# Patient Record
Sex: Male | Born: 1955 | Race: White | Hispanic: No | Marital: Married
Health system: Southern US, Community
[De-identification: ages and names within clinical notes are randomized; demographics above are authoritative.]

---

## 2005-03-30 ENCOUNTER — Ambulatory Visit: Payer: Self-pay | Admitting: Physician Assistant

## 2005-04-22 ENCOUNTER — Encounter: Payer: Self-pay | Admitting: Unknown Physician Specialty

## 2005-05-07 ENCOUNTER — Encounter: Payer: Self-pay | Admitting: Unknown Physician Specialty

## 2011-08-18 LAB — LIPASE, BLOOD: Lipase: 153 U/L (ref 73–393)

## 2011-08-18 LAB — URINALYSIS, COMPLETE
Bacteria: NONE SEEN
Blood: NEGATIVE
Glucose,UR: NEGATIVE mg/dL (ref 0–75)
Leukocyte Esterase: NEGATIVE
Nitrite: NEGATIVE
Ph: 5 (ref 4.5–8.0)
Specific Gravity: 1.025 (ref 1.003–1.030)
WBC UR: 1 /HPF (ref 0–5)

## 2011-08-18 LAB — CBC
HCT: 45.6 % (ref 40.0–52.0)
MCV: 87 fL (ref 80–100)
WBC: 4.1 10*3/uL (ref 3.8–10.6)

## 2011-08-18 LAB — COMPREHENSIVE METABOLIC PANEL
Albumin: 3.6 g/dL (ref 3.4–5.0)
Alkaline Phosphatase: 324 U/L — ABNORMAL HIGH (ref 50–136)
Anion Gap: 9 (ref 7–16)
BUN: 16 mg/dL (ref 7–18)
Bilirubin,Total: 4.6 mg/dL — ABNORMAL HIGH (ref 0.2–1.0)
Co2: 22 mmol/L (ref 21–32)
Creatinine: 0.81 mg/dL (ref 0.60–1.30)
EGFR (African American): >60
EGFR (Non-African Amer.): >60
Osmolality: 279 (ref 275–301)
SGOT(AST): 322 U/L — ABNORMAL HIGH (ref 15–37)
Sodium: 137 mmol/L (ref 136–145)

## 2011-08-19 ENCOUNTER — Inpatient Hospital Stay: Payer: Self-pay | Admitting: Surgery

## 2011-08-19 LAB — BASIC METABOLIC PANEL
Anion Gap: 8 (ref 7–16)
BUN: 11 mg/dL (ref 7–18)
Calcium, Total: 8 mg/dL — ABNORMAL LOW (ref 8.5–10.1)
Chloride: 107 mmol/L (ref 98–107)
Creatinine: 1.1 mg/dL (ref 0.60–1.30)
EGFR (African American): >60
Potassium: 4 mmol/L (ref 3.5–5.1)

## 2011-08-19 LAB — PROTIME-INR
INR: 0.9
Prothrombin Time: 12.8 secs (ref 11.5–14.7)

## 2011-08-19 LAB — CBC WITH DIFFERENTIAL/PLATELET
Eosinophil #: 0.1 10*3/uL (ref 0.0–0.7)
Eosinophil %: 2.5 %
MCH: 29.3 pg (ref 26.0–34.0)
MCHC: 33.4 g/dL (ref 32.0–36.0)
MCV: 88 fL (ref 80–100)
Monocyte %: 10.2 %
Neutrophil #: 1.7 10*3/uL (ref 1.4–6.5)
RBC: 5.05 10*6/uL (ref 4.40–5.90)
RDW: 13.8 % (ref 11.5–14.5)
WBC: 3.3 10*3/uL — ABNORMAL LOW (ref 3.8–10.6)

## 2011-08-19 LAB — HEPATIC FUNCTION PANEL A (ARMC)
Albumin: 3.3 g/dL — ABNORMAL LOW (ref 3.4–5.0)
Bilirubin, Direct: 3 mg/dL — ABNORMAL HIGH (ref 0.00–0.20)
SGOT(AST): 280 U/L — ABNORMAL HIGH (ref 15–37)
Total Protein: 6.2 g/dL — ABNORMAL LOW (ref 6.4–8.2)

## 2011-08-19 LAB — HEMOGLOBIN A1C: Hemoglobin A1C: 8.4 % — ABNORMAL HIGH (ref 4.2–6.3)

## 2011-08-20 LAB — COMPREHENSIVE METABOLIC PANEL
Albumin: 3 g/dL — ABNORMAL LOW (ref 3.4–5.0)
Anion Gap: 11 (ref 7–16)
BUN: 9 mg/dL (ref 7–18)
Bilirubin,Total: 2 mg/dL — ABNORMAL HIGH (ref 0.2–1.0)
Calcium, Total: 8.2 mg/dL — ABNORMAL LOW (ref 8.5–10.1)
Co2: 20 mmol/L — ABNORMAL LOW (ref 21–32)
Creatinine: 0.85 mg/dL (ref 0.60–1.30)
EGFR (African American): >60
EGFR (Non-African Amer.): >60
Glucose: 119 mg/dL — ABNORMAL HIGH (ref 65–99)
Osmolality: 279 (ref 275–301)
SGOT(AST): 142 U/L — ABNORMAL HIGH (ref 15–37)
SGPT (ALT): 418 U/L — ABNORMAL HIGH
Sodium: 140 mmol/L (ref 136–145)
Total Protein: 5.8 g/dL — ABNORMAL LOW (ref 6.4–8.2)

## 2011-08-20 LAB — LIPASE, BLOOD: Lipase: 126 U/L (ref 73–393)

## 2011-08-21 LAB — HEPATIC FUNCTION PANEL A (ARMC)
Albumin: 3.2 g/dL — ABNORMAL LOW (ref 3.4–5.0)
Bilirubin,Total: 1.5 mg/dL — ABNORMAL HIGH (ref 0.2–1.0)
SGPT (ALT): 313 U/L — ABNORMAL HIGH
Total Protein: 6.3 g/dL — ABNORMAL LOW (ref 6.4–8.2)

## 2011-11-23 ENCOUNTER — Ambulatory Visit: Payer: Self-pay | Admitting: Surgery

## 2011-11-23 LAB — BASIC METABOLIC PANEL
BUN: 19 mg/dL — ABNORMAL HIGH (ref 7–18)
Calcium, Total: 8.9 mg/dL (ref 8.5–10.1)
Co2: 29 mmol/L (ref 21–32)
EGFR (African American): >60
EGFR (Non-African Amer.): >60
Glucose: 172 mg/dL — ABNORMAL HIGH (ref 65–99)
Potassium: 4.2 mmol/L (ref 3.5–5.1)
Sodium: 138 mmol/L (ref 136–145)

## 2011-11-23 LAB — HEPATIC FUNCTION PANEL A (ARMC)
Albumin: 3.8 g/dL (ref 3.4–5.0)
Alkaline Phosphatase: 77 U/L (ref 50–136)
Bilirubin, Direct: 0.2 mg/dL (ref 0.00–0.20)
Bilirubin,Total: 1.1 mg/dL — ABNORMAL HIGH (ref 0.2–1.0)
SGOT(AST): 18 U/L (ref 15–37)
SGPT (ALT): 23 U/L (ref 12–78)
Total Protein: 6.6 g/dL (ref 6.4–8.2)

## 2011-11-23 LAB — CBC WITH DIFFERENTIAL/PLATELET
Basophil #: 0 10*3/uL (ref 0.0–0.1)
Eosinophil #: 0.1 10*3/uL (ref 0.0–0.7)
HCT: 42.7 % (ref 40.0–52.0)
Lymphocyte #: 1.2 10*3/uL (ref 1.0–3.6)
Lymphocyte %: 35.5 %
MCH: 29.1 pg (ref 26.0–34.0)
MCHC: 34.2 g/dL (ref 32.0–36.0)
MCV: 85 fL (ref 80–100)
Monocyte #: 0.3 x10 3/mm (ref 0.2–1.0)
Neutrophil #: 1.8 10*3/uL (ref 1.4–6.5)
Platelet: 162 10*3/uL (ref 150–440)
RDW: 13.6 % (ref 11.5–14.5)
WBC: 3.3 10*3/uL — ABNORMAL LOW (ref 3.8–10.6)

## 2011-11-30 ENCOUNTER — Ambulatory Visit: Payer: Self-pay | Admitting: Surgery

## 2012-01-28 ENCOUNTER — Encounter: Payer: Self-pay | Admitting: Internal Medicine

## 2014-06-26 NOTE — Op Note (Signed)
PATIENT NAME:  Dalton Lowery, Dalton Lowery MR#:  [id number] DATE OF BIRTH: [name] May 27, 1955  DATE OF PROCEDURE:  11/30/2011  PREOPERATIVE DIAGNOSIS: Chronic cholecystitis and cholelithiasis.   POSTOPERATIVE DIAGNOSIS: Chronic cholecystitis and cholelithiasis.   OPERATION: Laparoscopic cholecystectomy with cholangiography.   SURGEON: Quentin Orealph L. Ely. M.D.   ANESTHESIA: General.   OPERATIVE PROCEDURE: With the patient in the supine position after the induction of appropriate general anesthesia, the patient's abdomen was prepped with ChloraPrep and draped with sterile towels. The patient was placed in the head down, feet up position. A small infraumbilical incision was made in the standard fashion and carried down bluntly through the subcutaneous tissue. The Veress needle was used to cannulate the peritoneal cavity. CO2 was insufflated to appropriate pressure measurements. When approximately 2.5 liters of CO2 were instilled, the Veress needle was withdrawn and an 11 mm [employer] port was inserted into the peritoneal cavity. Peritoneal position was confirmed and CO2 was reinsufflated. The patient was placed in the head up, feet down position and rotated slightly to the left side. A subxiphoid transverse incision was made and an 11 mm port inserted under direct vision. Two lateral ports 5 mm in size were inserted under direct vision. The gallbladder was small and shrunken with multiple adhesions. He was grasped and retracted superiorly and laterally. The hepatoduodenal ligament was dissected exposing the cystic duct and cystic artery. The cystic duct was clipped on the gallbladder side and opened. An on-table cholangiogram using dynamic fluoroscopy revealed free flow of dye into the duodenum. Intrahepatic radicles were seen. No obstruction was identified. The catheter was withdrawn and the cystic duct doubly clipped on the common duct side and divided. The cystic artery was doubly clipped and divided. The gallbladder was  then dissected free from its bed and delivered using hook and cautery apparatus. There was a small rent in the dome of the gallbladder from the retractor. Once the gallbladder was free, it was collected in an Endo Catch apparatus and removed through the subxiphoid incision. The area was then copiously suctioned and irrigated. The upper midline fascia was closed with figure-of-eight suture using a needle passer and 0 Vicryl. The abdomen was then desufflated and all ports withdrawn without difficulty. The midline fascia was closed with figure-of-eight suture of 0 Vicryl and the skin was closed with 5-0 nylon. The area was infiltrated with 0.25% Marcaine for postoperative pain control. Sterile dressings were applied. The patient was returned to the recovery room having tolerated the procedure well. Sponge, instrument, and needle counts were correct x2 in the Operating Room.  ____________________________ Quentin Orealph L. Ely III, MD rle:slb D: 11/30/2011 09:48:39 ET T: 11/30/2011 10:08:59 ET JOB#: [id number]  cc: Quentin Orealph L. Ely III, MD, <Dictator> Quentin OreALPH L ELY MD ELECTRONICALLY SIGNED 12/02/2011 18:25

## 2014-07-01 NOTE — Consult Note (Signed)
Brief Consult Note: Diagnosis: diabetic and overall medical mgmt.   Patient was seen by consultant.   Consult note dictated.   Recommend further assessment or treatment.   Orders entered.   Discussed with Attending MD.   Comments: *DM-II (insulin-requiring) - pt will be NPO for now.  will hold scheduled lantus and metformin.  keep pt on ssi, cont frequent accuchecks, check A1c.  *blood pressure - pt has never been dx'd with htn, currently normotensive, on Enalapril at baseline and pt states this is to protect his kidneys, suspect he's on it due to microalbuminuria caused by DM. cont enalapril for now and monitor bp and renal fxn closely.  *abnormal LFTs - with possible cholecystitis and CBDO, will need MRCP, possible ERCP depending on results.  surgery following and GI to see pt as well.  pt will be NPO for now, also agree with iv hydration in the meanwhile and prn pain control as you're doing.  *Acid reflux/probable GERD - recommend starting PPI  *depression - cont citalopram and trazodone qhs  *chronic back pain - advised pt to avoid frequent/scheduled use of NSAIDS (take Aleve at home BID) due to increased risk of GI Bleed and renal impairment or if he takes it to take it with a PPI.  would avoid NSAIDS as much as possible for now.    thank you for the consult and for allowing me to participate in Mr. Dalton Lowery's care, will continue to follow..  Electronic Signatures: Camillo FlamingLateef, Keidan Aumiller (MD)  (Signed 11-Jun-13 15:36)  Authored: Brief Consult Note   Last Updated: 11-Jun-13 15:36 by Camillo FlamingLateef, Benny Henrie (MD)

## 2014-07-01 NOTE — Consult Note (Signed)
PATIENT NAME:  Dalton Lowery, Dalton Lowery MR#:  [id number] DATE OF BIRTH:  1955/11/01  DATE OF CONSULTATION:  08/18/2011  REFERRING PHYSICIAN:  Quentin Orealph L. Ely, III, MD CONSULTING PHYSICIAN:  Elon AlasKamran N. Ainara Eldridge, MD PRIMARY CARE PHYSICIAN[facility]  [facility]  REASON FOR CONSULTATION: Diabetic and medical management.   HISTORY OF PRESENT ILLNESS: The patient is a pleasant 59 year old male with past medical history listed below including type 2 diabetes mellitus-insulin requiring, acid reflux, depression, who was admitted under the Surgical service today due to abdominal pain which she has had on and off over the past couple of weeks and more severe over the past five days for which he came to the ER for further evaluation. He reports nonradiating epigastric pain initially 10 out of 10, associated with nausea and multiple episodes of vomiting over the past 24 hours. As above, his pain initially started a couple of weeks ago and has been more intense and severe this past week for which he came to the ER. Pain is somewhat better after he eats. He was up all night with severe abdominal pain and nausea and also multiple episodes of vomiting. He states he has vomited about 15 times over the past 24 hours. He also developed diarrhea yesterday which is nonbloody and watery in nature. He has not been able to keep any food or liquids down due to recurrent vomiting. He came to the ER for further evaluation and was noted to have abnormal liver function tests. An ultrasound of the abdomen was obtained suggestive of cholelithiasis without cholecystitis. There was also noted to be mild common bile dilatation without choledocholithiasis noted on ultrasound. Medical consultation was requested to assist with the patient's diabetes mellitus as well as the rest of his medical comorbidities. With pain medications, his abdominal pain has improved. Otherwise, he is without specific complaints at this time.   PAST SURGICAL HISTORY:  Colonoscopy.   PAST MEDICAL HISTORY:

## 2014-07-01 NOTE — Consult Note (Signed)
PATIENT NAME:  Dalton Lowery, Dalton Lowery MR#:  [id number] DATE OF BIRTH:  [birth year]  DATE OF CONSULTATION:  08/18/2011  REFERRING PHYSICIAN:   CONSULTING PHYSICIAN:  Scot Junobert T. Ariadna Setter, MD  HISTORY OF PRESENT ILLNESS: Patient is a 59 year old white male who was admitted to the hospital after having several days of nausea, vomiting, epigastric, right upper quadrant abdominal pain. He was found to have gallstones, slightly dilated common bile duct and elevated liver functions along with jaundice indicating common bile duct stone. I was asked to see him in consultation.   FAMILY HISTORY: Negative.   CURRENT MEDICATIONS:  1. Aleve 220 mg b.i.d. p.r.n. for back pain. 2. Citalopram 20 mg a day.  3. Enalapril 10 mg a day.  4. Humalog insulin 6 to 8 units before each meal. 5. Lantus insulin 45 units in the morning.  6. Metformin 1000 mg b.i.d.  7. Pepcid 20 mg a day.  8. Prilosec p.r.n.  9. Trazodone 150 mg a day.   ALLERGIES: No known drug allergies.   REVIEW OF SYSTEMS: He uses inhaler a little bit in hot summer months, otherwise no asthma, wheezing, emphysema, chronic cough or shortness of breath. No exertional chest pains. No skipping irregular heartbeats. No significant reflux esophagitis. No dysphagia. No dysuria or hematuria. No use of anticoagulants.   PHYSICAL EXAMINATION:  GENERAL: White male laying comfortably in bed.   HEENT: Sclerae slightly icteric. Conjunctivae negative. The head is atraumatic.   CHEST: Clear.   HEART: Regular rate and rhythm.   ABDOMEN: There is some tenderness in the epigastric area. Minimal tenderness right upper quadrant. Minimal tenderness in the suprapubic area.   EXTREMITIES: Negative.  VITAL SIGNS: Temperature 97.6, pulse 59 respirations 16, blood pressure 132/80, oxygen saturation 96%.   LABORATORY, DIAGNOSTIC AND RADIOLOGICAL DATA: Amylase 48, calcium 8.3, total bilirubin 4.6, alkaline phosphatase 324, SGPT 672, SGOT 322, albumin 3.6, glucose 164, BUN  16, creatinine 0.81. Urinalysis shows 1+ bilirubin, otherwise negative urine.   ASSESSMENT: Gallstone with common bile duct blockage causing liver inflammation and jaundice. Recommend ERCP with sphincterotomy followed by gallbladder removal. Will contact Dr. Bluford Kaufmannh. I have contacted his nurse practitioner, Owens Sharkawn Harrison, told her that patient would likely need an ERCP tomorrow. She will contact Dr. Bluford Kaufmannh as well. Will check a pro time in the morning and have Rocephin on call tomorrow.  ____________________________ Scot Junobert T. Shyloh Derosa, MD rte:cms D: 08/18/2011 19:41:48 ET T: 08/19/2011 06:11:12 ET JOB#: [id number]  cc: Scot Junobert T. Delta Deshmukh, MD, <Dictator> Carmie Endalph L. Ely III, MD Ezzard StandingPaul Y. Bluford Kaufmannh, MD Scot JunOBERT T Reighlyn Elmes MD ELECTRONICALLY SIGNED 09/01/2011 16:08

## 2014-07-01 NOTE — H&P (Signed)
PATIENT NAME:  Dalton, Lowery MR#:  [id number] DATE OF BIRTH:  [birth year]  DATE OF ADMISSION:  08/18/2011  PRIMARY CARE PHYSICIAN: [facility]   ADMITTING PHYSICIAN: Dr. Michela Pitcher   CHIEF COMPLAINT: Abdominal pain, nausea, and vomiting.   BRIEF HISTORY: Dalton Lowery is a 58 year old gentleman with known medical problems involving hypertension and adult onset insulin-dependent diabetes who presents to the Emergency Room with 5 to 6 days of significant abdominal pain, increasing nausea, vomiting, and then diarrhea over the last 12 to 24 hours. He's had bloating and indigestion for some time and his symptoms worsened over the last several days to where he had constant midepigastric right upper quadrant abdominal pain. He was unable to keep anything on his stomach vomiting multiple times. He last ate in the evening last night and was unable to keep that on his stomach. He has had a headache from being dehydrated. He presented to the Emergency Room for further evaluation. He was noted to have elevated liver function studies and gallbladder ultrasound was undertaken. The study revealed cholelithiasis without evidence of cholecystitis. He did have a slightly elevated common bile duct. The surgical service was consulted.   He denies any previous history of significant gallbladder problems. He does have a history of hepatitis as a young man with an illness lasting approximately a month to six weeks. He thought the hepatitis was the A viral strain but he's uncertain. He had some jaundice at that time. He has no history of pancreatitis, peptic ulcer disease, or diverticulitis. He's never had any surgery. He has no cardiac disease but does have adult onset diabetes for approximately 20 years. In addition he has significant hypertension. He is not a cigarette smoker. Drinks alcohol only occasionally. 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   FAMILY HISTORY: Noncontributory.   CURRENT  MEDICATIONS:  1. Aleve 220 mg b.i.d. p.r.n.  2. Citalopram 20 mg p.o. daily.  3. Enalapril 10 mg once a day. 4. Humalog insulin 100 mg/mL 6 to 8 units before each meal. 5. Lantus 100 mg/mL 45 units once a day in the morning. 6. Metformin 1000 mg b.i.d.  7. Pepcid 20 mg p.o. daily.  8. Prilosec as necessary. 9. Trazodone 150 mg once a day.   ALLERGIES: He has no medical allergies.    REVIEW OF SYSTEMS: 10 point review of systems is undertaken. He does not have any significant hearing or visual changes. He has had no swallowing problems. He has no shortness of breath or dyspnea on exertion. He has no chest pain, rhythm disturbances, or orthopnea. GI symptoms are noted above. He has no GU symptoms with dysuria, frequency, or urgency. He has no psychiatric symptoms and no neurologic symptoms.   PHYSICAL EXAMINATION:   GENERAL: He is an alert, pleasant, comfortable gentleman in moderate distress from pain. He has received some pain medicine and actually feels better.   VITALS: Blood pressure 148/74, heart rate 68 and regular, oxygen saturation 95%.  HEENT: Some scleral icterus with no facial deformities and normal equally round pupils.   NECK: Supple without adenopathy. His trachea is midline. I cannot palpate his thyroid gland.   LUNGS: Clear with no adventitious sounds. He has normal pulmonary excursion.   CARDIAC: No murmurs or gallops to my ear. He seems to be in normal sinus rhythm.   ABDOMEN: Generally soft with some mild midepigastric right upper quadrant tenderness. He has active bowel sounds. No rebound. No guarding. No masses. No  hernias.   EXTREMITIES: Lower extremity exam reveals full range of motion. Good distal pulses.   PSYCHIATRIC: Normal orientation, normal affect.   IMPRESSION: This gentleman appears to have common bile duct obstruction based on cholelithiasis and choledocholithiasis. He does not appear to have any evidence of acute cholecystitis. His laboratory values  demonstrate a bilirubin of 4.6 with an alkaline phosphatase of 324. SGPT is 672. SGOT is 322. Lipase and amylase are normal. White blood cell count is 4100. Platelet count is slightly depressed at 127. Ultrasound showed some mild distention of the common bile duct with no gallbladder wall thickening and multiple gallstones. Will plan to admit him to the hospital, place him on IV rehydration and antibiotics. I have spoken with the GI service who recommended MRCP which we will attempt to obtain this afternoon. Will have the GI service see him. I have asked the Internal Medicine service to assist in management of his diabetes and hypertension. They have courteously agreed to assist us in this problem. I discussed this plan with the patient and his wife and they are in agreement.  ____________________________ Carmie Endalph L. Ely III, MD rle:drc D: 08/18/2011 14:10:58 ET T: 08/18/2011 14:27:23 ET JOB#: [id number]  cc: Quentin Orealph L. Ely III, MD, <Dictator> Quentin OreALPH L ELY MD ELECTRONICALLY SIGNED 08/19/2011 18:00

## 2014-07-01 NOTE — Consult Note (Signed)
Chief Complaint:   Subjective/Chief Complaint Feels better. Mild abd pain. LFT improving. No evidence of pancreatitis.   VITAL SIGNS/ANCILLARY NOTES: **Vital Signs.:   13-Jun-13 05:03   Vital Signs Type Routine   Temperature Temperature (F) 97.7   Celsius 36.5   Temperature Source oral   Pulse Pulse 56   Pulse source per vital sign device   Respirations Respirations 20   Systolic BP Systolic BP 135   Diastolic BP (mmHg) Diastolic BP (mmHg) 82   Mean BP 99   BP Source vital sign device   Systolic BP Systolic BP 144   Diastolic BP (mmHg) Diastolic BP (mmHg) 76   Pulse Pulse Sitting 73   Systolic BP Systolic BP 153   Diastolic BP (mmHg) Diastolic BP (mmHg) 69   Pulse Standing Pulse Standing 83   Pulse Ox % Pulse Ox % 94   Pulse Ox Activity Level  At rest   Oxygen Delivery Room Air/ 21 %   Brief Assessment:   Cardiac Regular    Respiratory clear BS    Gastrointestinal minimal abd tenderness   Assessment/Plan:  Assessment/Plan:   Assessment CBD stone, extracted. No signs of pancreatitis.    Plan Clear liquid diet ordered. Surgery planned later. Expect LFT to continue improving. Will sign off. Thanks.   Electronic Signatures: Lutricia Feilh, Akeila Lana (MD)  (Signed 13-Jun-13 08:58)  Authored: Chief Complaint, VITAL SIGNS/ANCILLARY NOTES, Brief Assessment, Assessment/Plan   Last Updated: 13-Jun-13 08:58 by Lutricia Feilh, Kirbi Farrugia (MD)

## 2014-07-01 NOTE — Discharge Summary (Signed)
PATIENT NAME:  Dalton Lowery, Dalton E MR#:  [id number] DATE OF BIRTH:  [birth year]  DATE OF ADMISSION:  08/19/2011 DATE OF DISCHARGE:  08/21/2011  BRIEF HISTORY: Dalton Lowery is a 59 year old gentleman admitted through the Emergency Room with abdominal pain, elevated bilirubin suggestive of biliary tract disease. He had marked abdominal discomfort over several days and did not improve. He finally presented to the Emergency Room for further evaluation. Work-up in the Emergency Room revealed multiple liver abnormalities with an elevated alkaline phosphatase at 324, bilirubin of 4.6, SGOT of 322 and an SGPT of 672. Ultrasound demonstrated multiple gallstones with mild common bile duct enlargement. He was admitted to the hospital, underwent an M.R.C.P. which did not demonstrate any evidence of significant bile duct obstruction but he did continue to have an elevated bilirubin. He was seen by the gastroenterology service and Dr. Lutricia FeilPaul Oh performed ERCP on the 12th. He had marked narrowing of the lower bile duct with an impacted common duct stone. The stone was removed. He did well over the next 48 hours. He did not demonstrate any evidence of postprocedure pancreatitis. His bilirubin came down to 1.5. Discharged home on the 14th to be followed in the office in 7 to 10 days' time. We anticipated at some point in the near future recommending laparoscopic cholecystectomy to prevent further recurrence of a similar nature.   DISCHARGE MEDICATIONS:  1. Metformin 1000 mg b.i.d.  2. Lantus insulin 45 units q.a.m.  3. Enalapril 10 mg p.o. daily.  4. 6 to 8 units of Humalog Quik pen insulin a.c.  5. Citalopram 20 mg p.o. daily.  6. Prilosec 20 mg a p.o. daily.  7. Trazodone 150 mg 0.5 tablet at bedtime p.r.n.   FINAL DISCHARGE DIAGNOSES:  1. Cholelithiasis. 2. Chronic cholecystitis. 3. Choledocholithiasis.      SURGERY: ERCP.   ____________________________ Carmie Endalph L. Ely III, MD rle:cms D: 08/26/2011 08:32:20  ET T: 08/26/2011 11:16:16 ET JOB#: [id number]  cc: Carmie Endalph L. Ely III, MD, <Dictator> Ezzard StandingPaul Y. Bluford Kaufmannh, MD Quentin OreALPH L ELY MD ELECTRONICALLY SIGNED 08/28/2011 19:08

## 2014-07-01 NOTE — Consult Note (Signed)
Difficult cannulation due to stone impacted in distal CBD.  One stone removed. Keep patient npo except for meds/ice chips today. Recheck LFT. Should resolve quickly. Will check lipase in AM. Thanks  Electronic Signatures: Lutricia Feilh, Damarius Karnes (MD)  (Signed on 12-Jun-13 13:56)  Authored  Last Updated: 12-Jun-13 13:56 by Lutricia Feilh, Ascencion Stegner (MD)
